# Patient Record
Sex: Female | Born: 2000 | Race: White | Hispanic: No | Marital: Single | Smoking: Never smoker
Health system: Southern US, Community
[De-identification: ages and names within clinical notes are randomized; demographics above are authoritative.]

---
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
## 2021-01-02 ENCOUNTER — Telehealth: Payer: BC Managed Care – PPO | Admitting: Nurse Practitioner

## 2021-01-20 ENCOUNTER — Other Ambulatory Visit: Payer: Self-pay | Admitting: Nurse Practitioner

## 2021-01-20 DIAGNOSIS — F410 Panic disorder [episodic paroxysmal anxiety] without agoraphobia: Secondary | ICD-10-CM

## 2021-01-30 ENCOUNTER — Ambulatory Visit: Payer: BC Managed Care – PPO | Admitting: Nurse Practitioner

## 2021-01-30 ENCOUNTER — Other Ambulatory Visit: Payer: Self-pay

## 2021-01-30 DIAGNOSIS — F410 Panic disorder [episodic paroxysmal anxiety] without agoraphobia: Secondary | ICD-10-CM

## 2021-01-30 MED ORDER — ESCITALOPRAM OXALATE 10 MG PO TABS
10.0000 mg | ORAL_TABLET | Freq: Every day | ORAL | 0 refills | Status: DC
Start: 2021-01-30 — End: 2021-04-23

## 2021-01-30 NOTE — Progress Notes (Signed)
   Subjective:    Patient ID: Darlene Hall, female    DOB: 2000-05-06, 20 y.o.   MRN: [id number]  HPI  20 year old female returning to [facility] for follow up regarding Lexapro medication and anxiety management. She has been taking her medication on a regular basis, denies any side effects. She has now moved to college and has established with the student health facility there.   PHQ 9 is 0 GAD 7 0   Today's Vitals   01/30/21 1003  BP: 106/66  Pulse: (!) 102  Resp: 16  Temp: 98.1 F (36.7 C)  TempSrc: Oral  SpO2: 98%   There is no height or weight on file to calculate BMI.   Review of Systems  Constitutional: Negative.   HENT: Negative.    Eyes: Negative.   Respiratory: Negative.    Cardiovascular: Negative.   Gastrointestinal: Negative.   Neurological: Negative.   Psychiatric/Behavioral: Negative.        Objective:   Physical Exam Constitutional:      Appearance: Normal appearance.  HENT:     Head: Normocephalic.  Neurological:     General: No focal deficit present.     Mental Status: She is alert.  Psychiatric:        Mood and Affect: Mood normal.          Assessment & Plan:  1. Generalized anxiety disorder with panic attacks  - escitalopram (LEXAPRO) 10 MG tablet; Take 1 tablet (10 mg total) by mouth daily.  Dispense: 90 tablet; Refill: 0    Recommended return to clinic over winter break, earlier with any concerns as well

## 2021-02-01 ENCOUNTER — Encounter: Payer: Self-pay | Admitting: Nurse Practitioner

## 2021-02-17 ENCOUNTER — Ambulatory Visit: Payer: Self-pay | Admitting: Dermatology

## 2021-04-23 ENCOUNTER — Other Ambulatory Visit: Payer: Self-pay

## 2021-04-23 ENCOUNTER — Ambulatory Visit: Payer: BC Managed Care – PPO | Admitting: Nurse Practitioner

## 2021-04-23 DIAGNOSIS — F419 Anxiety disorder, unspecified: Secondary | ICD-10-CM

## 2021-04-23 MED ORDER — ESCITALOPRAM OXALATE 5 MG PO TABS: 15.0000 mg | ORAL_TABLET | Freq: Every day | ORAL | 2 refills | Status: DC

## 2021-04-23 NOTE — Progress Notes (Signed)
Virtual Visit Consent   Tiffony Kite, you are scheduled for a virtual visit with a [facility] provider today.     Just as with appointments in the office, your consent must be obtained to participate.  Your consent will be active for this visit and any virtual visit you may have with one of our providers in the next 365 days.     If you have a MyChart account, a copy of this consent can be sent to you electronically.  All virtual visits are billed to your insurance company just like a traditional visit in the office.    As this is a virtual visit, video technology does not allow for your provider to perform a traditional examination.  This may limit your provider's ability to fully assess your condition.  If your provider identifies any concerns that need to be evaluated in person or the need to arrange testing (such as labs, EKG, etc.), we will make arrangements to do so.     Although advances in technology are sophisticated, we cannot ensure that it will always work on either your end or our end.  If the connection with a video visit is poor, the visit may have to be switched to a telephone visit.  With either a video or telephone visit, we are not always able to ensure that we have a secure connection.     I need to obtain your verbal consent now.   Are you willing to proceed with your visit today?    Gradie Butrick has provided verbal consent on 04/23/2021 for a virtual visit (video or telephone).   Viviano Simas, FNP   Date: 04/23/2021 2:06 PM   Virtual Visit via Video Note   I, Viviano Simas, connected with  Apphia Cropley  ([id number], 2000-07-20) on 04/23/21 at  2:00 PM EST by a video-enabled telemedicine application and verified that I am speaking with the correct person using two identifiers.  Location: Patient: Virtual Visit Location Patient: Home Provider: Virtual Visit Location Provider: Office/Clinic   I discussed the limitations of evaluation and management by  telemedicine and the availability of in person appointments. The patient expressed understanding and agreed to proceed.    History of Present Illness: Darlene Hall is a 21 y.o. who identifies as a female who was assigned female at birth, and is being seen today for ongoing anxiety. She was initially well managed on Lexapro at 10mg  and was doing well adjusting to life at college. Towards the end of her last semester at school she noted a slight increase in anxiety, she was having more difficulty sleeping and attending classes due to anxiety.   She has been back at home for Winter break and feels that her anxiety has hightened while back at home as well.   Denies any SE from medications   She has been in therapy and has also initiated care with the student health facility at her college    Problems:  Patient Active Problem List   Diagnosis Date Noted   Anxiety 09/26/2020    Allergies: No Known Allergies  Medications:     Observations/Objective:  No labored breathing.  Speech is clear and coherent with logical content.  Patient is alert and oriented at baseline.  Denies suicidal or homicidal ideation   Assessment and Plan: 1. Anxiety  - escitalopram (LEXAPRO) 5 MG tablet; Take 3 tablets (15 mg total) by mouth daily.  Dispense: 90 tablet; Refill: 2    Increase dose to 15mg   for two weeks if optimal symptom mangement is not achieved will consider increasing to 20mg  at that time.  Patient will call for follow up appointment as discussed Also encouraged follow up with Psych provider at the student health facility and discussed that if additional medication are needed to control symptoms she will need to see a specialist.   Discussed limitation of Occupational Health Clinic.  Patient understands and is agreeable with plan   Follow Up Instructions: I discussed the assessment and treatment plan with the patient. The patient was provided an opportunity to ask questions and all were  answered. The patient agreed with the plan and demonstrated an understanding of the instructions.  A copy of instructions were sent to the patient via MyChart unless otherwise noted below.     The patient was advised to call back or seek an in-person evaluation if the symptoms worsen or if the condition fails to improve as anticipated.  Time:  I spent 10 minutes with the patient via telehealth technology discussing the above problems/concerns.    , FNP

## 2021-04-24 ENCOUNTER — Encounter: Payer: Self-pay | Admitting: Nurse Practitioner

## 2021-05-04 ENCOUNTER — Other Ambulatory Visit: Payer: Self-pay | Admitting: Nurse Practitioner

## 2021-05-15 ENCOUNTER — Other Ambulatory Visit: Payer: Self-pay | Admitting: Nurse Practitioner

## 2021-05-15 DIAGNOSIS — F419 Anxiety disorder, unspecified: Secondary | ICD-10-CM

## 2021-06-12 ENCOUNTER — Other Ambulatory Visit: Payer: Self-pay

## 2021-06-12 ENCOUNTER — Ambulatory Visit: Payer: BC Managed Care – PPO | Admitting: Nurse Practitioner

## 2021-06-12 DIAGNOSIS — F419 Anxiety disorder, unspecified: Secondary | ICD-10-CM

## 2021-06-12 MED ORDER — ESCITALOPRAM OXALATE 20 MG PO TABS: 20.0000 mg | ORAL_TABLET | Freq: Every day | ORAL | 0 refills | Status: DC

## 2021-06-12 NOTE — Progress Notes (Signed)
Virtual Visit Consent   Darlene Hall, you are scheduled for a virtual visit with a [facility] provider today.     Just as with appointments in the office, your consent must be obtained to participate.  Your consent will be active for this visit and any virtual visit you may have with one of our providers in the next 365 days.     If you have a MyChart account, a copy of this consent can be sent to you electronically.  All virtual visits are billed to your insurance company just like a traditional visit in the office.    As this is a virtual visit, video technology does not allow for your provider to perform a traditional examination.  This may limit your provider's ability to fully assess your condition.  If your provider identifies any concerns that need to be evaluated in person or the need to arrange testing (such as labs, EKG, etc.), we will make arrangements to do so.     Although advances in technology are sophisticated, we cannot ensure that it will always work on either your end or our end.  If the connection with a video visit is poor, the visit may have to be switched to a telephone visit.  With either a video or telephone visit, we are not always able to ensure that we have a secure connection.     I need to obtain your verbal consent now.   Are you willing to proceed with your visit today?    [name] has provided verbal consent on 06/12/2021 for a virtual visit (telephone).   Apolonio Schneiders, FNP   Date: 06/12/2021 2:23 PM   Virtual Visit via Video Note   I, Apolonio Schneiders, connected with  [name]  ([id number], 2000-07-21) on 06/12/21 at  2:30 PM EST by a video-enabled telemedicine application and verified that I am speaking with the correct person using two identifiers.  Location: Patient: Virtual Visit Location Patient: Home Provider: Virtual Visit Location Provider: Home Office   I discussed the limitations of evaluation and management by telemedicine and  the availability of in person appointments. The patient expressed understanding and agreed to proceed.    History of Present Illness: Darlene Hall is a 21 y.o. who identifies as a female who was assigned female at birth, and is being seen today for ongoing anxiety. She was recently increased to 15mg   on 04/23/21 and has tolerated well but has not noticed much improvement with the increased dosage. We had previously discussed increasing to 20mg  if there was not notable improvement in her anxiety at the 15mg  dosage.  Denies SE  Anxiety is mostly related to social events, attending school is going well   Problems:  Patient Active Problem List   Diagnosis Date Noted   Anxiety 09/26/2020    Allergies: No Known Allergies Medications:  Current Outpatient Medications:    Adapalene (DIFFERIN) 0.3 % gel, Apply 1 application topically at bedtime. qhs to face for acne, Disp: 45 g, Rfl: 3   Clascoterone (WINLEVI) 1 % CREA, Apply 1 application topically in the morning. qam to face for acne, Disp: 60 g, Rfl: 3   escitalopram (LEXAPRO) 5 MG tablet, Take 3 tablets (15 mg total) by mouth daily., Disp: 90 tablet, Rfl: 2  Observations/Objective:  No labored breathing.  Speech is clear and coherent with logical content.  Patient is alert and oriented at baseline.    Assessment and Plan: 1. Anxiety  Meds ordered this encounter  Medications   escitalopram (LEXAPRO) 20 MG tablet    Sig: Take 1 tablet (20 mg total) by mouth at bedtime.    Dispense:  30 tablet    Refill:  0    RTC in 2 weeks for follow up while home for spring break  Patient has also established with PCP in [place]- will provide medical records when in clinic in order for her to transfer care      Follow Up Instructions: I discussed the assessment and treatment plan with the patient. The patient was provided an opportunity to ask questions and all were answered. The patient agreed with the plan and demonstrated an  understanding of the instructions.  A copy of instructions were sent to the patient via MyChart unless otherwise noted below.    The patient was advised to call back or seek an in-person evaluation if the symptoms worsen or if the condition fails to improve as anticipated.  Time:  I spent 10 minutes with the patient via telehealth technology discussing the above problems/concerns.    Apolonio Schneiders, FNP

## 2021-07-06 ENCOUNTER — Other Ambulatory Visit: Payer: Self-pay | Admitting: Nurse Practitioner

## 2021-07-06 DIAGNOSIS — F419 Anxiety disorder, unspecified: Secondary | ICD-10-CM

## 2021-08-25 ENCOUNTER — Encounter: Payer: Self-pay | Admitting: Podiatrist

## 2021-08-25 ENCOUNTER — Ambulatory Visit: Payer: BC Managed Care – PPO | Admitting: Podiatrist

## 2021-08-25 DIAGNOSIS — B070 Plantar wart: Secondary | ICD-10-CM | POA: Diagnosis not present

## 2021-08-25 MED ORDER — FLUOROURACIL 5 % EX CREA: TOPICAL_CREAM | Freq: Two times a day (BID) | CUTANEOUS | 0 refills | Status: AC

## 2021-08-25 NOTE — Progress Notes (Signed)
?  Chief Complaint  ?Patient presents with  ? Plantar Warts  ?  I have a few spots on the left foot and hurts to be on it a lot and is not getting any bigger  ?  ? ?HPI: Patient is 21 y.o. 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  She had the lesion in the past and was treated successfully by Dr. Allena Katz. However the lesion has since returned.  She is in college in [place] and is home for a week before she returns to [place] for the summer.  ? ?Patient Active Problem List  ? Diagnosis Date Noted  ? Anxiety 09/26/2020  ? ? ?Current Outpatient Medications on File Prior to Visit  ?Medication Sig Dispense Refill  ? Adapalene (DIFFERIN) 0.3 % gel Apply 1 application topically at bedtime. qhs to face for acne 45 g 3  ? Clascoterone (WINLEVI) 1 % CREA Apply 1 application topically in the morning. qam to face for acne 60 g 3  ? escitalopram (LEXAPRO) 20 MG tablet TAKE 1 TABLET BY MOUTH EVERYDAY AT BEDTIME 90 tablet 1  ? ?No current facility-administered medications on file prior to visit.  ? ? ?No Known Allergies ? ?Review of Systems ?No fevers, chills, nausea, muscle aches, no difficulty breathing, no calf pain, no chest pain or shortness of breath. ? ? ?Physical Exam ? ?GENERAL APPEARANCE: Alert, conversant. Appropriately groomed. No acute distress.  ? ?VASCULAR: Pedal pulses palpable DP and PT bilateral.  Capillary refill time is immediate to all digits,  Proximal to distal cooling it warm to warm.  Digital perfusion adequate.  ? ?NEUROLOGIC: sensation is intact to 5.07 monofilament at 5/5 sites bilateral.  Light touch is intact bilateral, vibratory sensation intact bilateral ? ?MUSCULOSKELETAL: acceptable muscle strength, tone and stability bilateral.  No gross boney pedal deformities noted.  No pain, crepitus or limitation noted with foot and ankle range of motion bilateral.  ? ?DERMATOLOGIC: skin is warm, supple, and dry.  Color, texture, and turgor of skin  within normal limits.  ?Well circumscribed verucca is present submetatarsal 5 of the left foot with a tiny satellite lesion just distal to the primary lesion at 11 o'clock.  The primary lesion has multiple capillary budding throughtout and califlour like appearance consistent with a plantar verucca.  ? ?Assessment  ? ?  ICD-10-CM   ?1. Plantar verruca  B07.0   ?  ? ? ? ?Plan ? ?Discussed treatment options and recommendations.  Today I pared the lesion down and applied cantharone to the lesion.  I gave instructions for aftercare.  I also prescribed Effudex to apply to the verucca at home.  If there is no improvement in a month or so, I advised her to follow up with Milbert Coulter, DPM in [place].   ?

## 2021-08-25 NOTE — Patient Instructions (Signed)
Dr. Faylene Million- DPM-- he's a podiatrist in [place] who is really good and can help you if your spot is stubborn and doesn't go away in a couple weeks.   ? ?AFTERCARE INSTRUCTIONS FOR CANTHARIDIN (WART) TREATMENT: ? ?1:  Leave the tape on the affected area for 24 hours after treatment-  do not get the foot wet. ?** if you experience any pain, burning, or discomfort wash the area sooner than 24 hours.  ? ?2. After 24 hours- remove the tape and wash the wart with soap and water ? ?3.  Expect blistering the day after treatment-  the blister may enlarge and fill with fluid-  if this occurs you may make a small hole in the blister using a sterile needle (can also dip a pin or sewing needle in rubbing alcohol and wipe dry) and squeeze fluid out by gently squeezing blister with clean hands. You may also apply a cool compress and take oral pain medications (tylenol or advil) for pain. ? ?4.  Do not remove the top of the blister.  Apply vaseline twice daily for 5-10 days or until healing occurs.  A crusty lesion will occur and this will fall off on its own after 4-5 days. ? ?5.  After skin healing has occurred after cantharidin treatment, apply effudex prescription cream daily until the lesion goes away.    ? ?6.  The treated area may look red or may itch.  Call the office if there is still significant burning, stinging or pain after the blister is popped.  ? ?7.  It is rare, but anyone can have a severe allergic reaction to any medication-  call 911 if you have a severe reaction (trouble breathing, hives, trouble swallowing) after treatment.  ? ?

## 2022-01-23 ENCOUNTER — Other Ambulatory Visit: Payer: Self-pay | Admitting: Nurse Practitioner

## 2022-01-23 DIAGNOSIS — F419 Anxiety disorder, unspecified: Secondary | ICD-10-CM

## 2022-05-07 ENCOUNTER — Ambulatory Visit (INDEPENDENT_AMBULATORY_CARE_PROVIDER_SITE_OTHER): Payer: Self-pay | Admitting: Physician Assistant

## 2022-05-07 DIAGNOSIS — F419 Anxiety disorder, unspecified: Secondary | ICD-10-CM

## 2022-05-07 MED ORDER — ESCITALOPRAM OXALATE 20 MG PO TABS: 20.0000 mg | ORAL_TABLET | Freq: Every day | ORAL | 0 refills | Status: AC

## 2022-05-07 NOTE — Progress Notes (Signed)
Virtual Visit Consent   Darlene Hall, you are scheduled for a virtual visit with a [facility] provider today. Just as with appointments in the office, your consent must be obtained to participate. Your consent will be active for this visit and any virtual visit you may have with one of our providers in the next 365 days. If you have a MyChart account, a copy of this consent can be sent to you electronically.  As this is a virtual telephone visit which doesn't allow your provider to perform a traditional examination and may limit your provider's ability to fully assess your condition. If your provider identifies any concerns that need to be evaluated in person or the need to arrange testing (such as labs, EKG, etc.), we will make arrangements to do so. Although advances in technology are sophisticated, we cannot ensure that it will always work on either your end or our end. If the connection with a video visit is poor, the visit may have to be switched to a telephone visit. With either a video or telephone visit, we are not always able to ensure that we have a secure connection.  By engaging in this virtual visit, you consent to the provision of healthcare and authorize for your insurance to be billed (if applicable) for the services provided during this visit. Depending on your insurance coverage, you may receive a charge related to this service.  I need to obtain your verbal consent now. Are you willing to proceed with your visit today? Darlene Hall has provided verbal consent on 05/07/2022 for a virtual visit (video or telephone). Johnna Acosta, [place]  Date: 05/07/2022 8:43 AM  Virtual Visit via Telephone Note   I, Johnna Acosta, connected with  Darlene Hall  ([id number], [birth year]) on 05/07/22 at  9:00 AM EST by a telephone and verified that I am speaking with the correct person using two identifiers.  Location: Patient: Virtual Visit Location Patient: Home Provider: Virtual Visit  Location Provider: Office/Clinic   I discussed the limitations of evaluation and management by telemedicine and the availability of in person appointments. The patient expressed understanding and agreed to proceed.    History of Present Illness: Darlene Hall is a 22 y.o. who identifies as a female who was assigned female at birth, and is being seen today for refill of Lexapro.  HPI: 22 y/o F via telehealth request to refill Lexapro for anxiety. Pt is currently attending university in [place], [place]. She has been on Lexapro previously and has done well with anxiety symptoms while on this medicine. She denies any side effects. Currently denies SI or HI. Patient was off this medicine for a while and would like to re-start this medicine.   Anxiety      Problems:  Patient Active Problem List   Diagnosis Date Noted   Anxiety 09/26/2020    Allergies: No Known Allergies Medications:  Current Outpatient Medications:    escitalopram (LEXAPRO) 20 MG tablet, Take 1 tablet (20 mg total) by mouth daily. Start 1/2 tablet by mouth x 7 days then 1 tablet by mouth following days., Disp: 45 tablet, Rfl: 0   Adapalene (DIFFERIN) 0.3 % gel, Apply 1 application topically at bedtime. qhs to face for acne, Disp: 45 g, Rfl: 3   Clascoterone (WINLEVI) 1 % CREA, Apply 1 application topically in the morning. qam to face for acne, Disp: 60 g, Rfl: 3   fluorouracil (EFUDEX) 5 % cream, Apply topically 2 (two) times daily., Disp: 40 g, Rfl: 0  Observations/Objective: Patient is well-developed, well-nourished in no acute distress.  Resting comfortably at home.  Head is normocephalic, atraumatic.  No labored breathing.  Speech is clear and coherent with logical content.  Patient is alert and oriented at baseline.    Assessment and Plan: 1. Anxiety - escitalopram (LEXAPRO) 20 MG tablet; Take 1 tablet (20 mg total) by mouth daily. Start 1/2 tablet by mouth x 7 days then 1 tablet by mouth following days.   Dispense: 45 tablet; Refill: 0  Start medicine as prescribed. Reviewed side effects of the medicine. She verbalized understanding. Encouraged and recommended patient should follow up with PCP for further refills for the same medicine.  Pt verbalized understanding and in agreement.    Follow Up Instructions: I discussed the assessment and treatment plan with the patient. The patient was provided an opportunity to ask questions and all were answered. The patient agreed with the plan and demonstrated an understanding of the instructions.  A copy of instructions were sent to the patient via MyChart unless otherwise noted below.    The patient was advised to call back or seek an in-person evaluation if the symptoms worsen or if the condition fails to improve as anticipated.  Time:  I spent 10 minutes with the patient via telehealth technology discussing the above problems/concerns.    Johnna Acosta, PA-C
